# Patient Record
Sex: Male | Born: 1980 | Race: White | Hispanic: No | Marital: Single | Smoking: Current every day smoker
Health system: Southern US, Community
[De-identification: ages and names within clinical notes are randomized; demographics above are authoritative.]

---

## 2005-03-03 ENCOUNTER — Emergency Department: Payer: Self-pay | Admitting: General Practice

## 2005-03-03 ENCOUNTER — Emergency Department: Payer: Self-pay | Admitting: Unknown Physician Specialty

## 2020-02-25 ENCOUNTER — Encounter: Payer: Self-pay | Admitting: Intensive Care

## 2020-02-25 ENCOUNTER — Other Ambulatory Visit: Payer: Self-pay

## 2020-02-25 ENCOUNTER — Emergency Department
Admission: EM | Admit: 2020-02-25 | Discharge: 2020-02-25 | Disposition: A | Discharge status: Home / Self Care | Payer: Self-pay | Attending: Emergency Medicine | Admitting: Emergency Medicine

## 2020-02-25 ENCOUNTER — Emergency Department: Payer: Self-pay

## 2020-02-25 DIAGNOSIS — Z0289 Encounter for other administrative examinations: Secondary | ICD-10-CM | POA: Insufficient documentation

## 2020-02-25 DIAGNOSIS — Y92410 Unspecified street and highway as the place of occurrence of the external cause: Secondary | ICD-10-CM | POA: Insufficient documentation

## 2020-02-25 DIAGNOSIS — F17210 Nicotine dependence, cigarettes, uncomplicated: Secondary | ICD-10-CM | POA: Insufficient documentation

## 2020-02-25 DIAGNOSIS — S2231XA Fracture of one rib, right side, initial encounter for closed fracture: Secondary | ICD-10-CM | POA: Insufficient documentation

## 2020-02-25 DIAGNOSIS — Y9389 Activity, other specified: Secondary | ICD-10-CM | POA: Insufficient documentation

## 2020-02-25 DIAGNOSIS — Y999 Unspecified external cause status: Secondary | ICD-10-CM | POA: Insufficient documentation

## 2020-02-25 MED ORDER — METHOCARBAMOL 500 MG PO TABS: 500.0000 mg | ORAL_TABLET | Freq: Once | ORAL | Status: DC

## 2020-02-25 MED ORDER — NAPROXEN 500 MG PO TABS
500.0000 mg | ORAL_TABLET | Freq: Once | ORAL | Status: AC
  Administered 2020-02-25: 500 mg via ORAL
  Filled 2020-02-25: qty 1

## 2020-02-25 NOTE — ED Notes (Signed)
See triage note  Presents with police  States he is having rib pain  Needs clearance

## 2020-02-25 NOTE — ED Triage Notes (Signed)
Patient arrived in handcuffs by [employer]. Patient needs medical clearance to go to jail. Patient c/o right sided rib pain. Ambulatory into triage

## 2020-02-25 NOTE — ED Notes (Signed)
No peripheral IV placed this visit.   Discharge instructions reviewed with patient. Questions fielded by this RN. Patient verbalizes understanding of instructions. Patient discharged home in stable condition per provider . No acute distress noted at time of discharge.   Pt leaving with [employer] deputies in wheelchair att
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
## 2021-01-20 ENCOUNTER — Emergency Department
Admission: EM | Admit: 2021-01-20 | Discharge: 2021-01-22 | Disposition: A | Discharge status: Home / Self Care | Payer: Self-pay | Attending: Emergency Medicine | Admitting: Emergency Medicine

## 2021-01-20 ENCOUNTER — Other Ambulatory Visit: Payer: Self-pay

## 2021-01-20 ENCOUNTER — Emergency Department: Payer: Self-pay

## 2021-01-20 DIAGNOSIS — R4020 Unspecified coma: Secondary | ICD-10-CM | POA: Insufficient documentation

## 2021-01-20 DIAGNOSIS — Z20822 Contact with and (suspected) exposure to covid-19: Secondary | ICD-10-CM | POA: Insufficient documentation

## 2021-01-20 DIAGNOSIS — F17210 Nicotine dependence, cigarettes, uncomplicated: Secondary | ICD-10-CM | POA: Insufficient documentation

## 2021-01-20 DIAGNOSIS — F1910 Other psychoactive substance abuse, uncomplicated: Secondary | ICD-10-CM | POA: Insufficient documentation

## 2021-01-20 DIAGNOSIS — F32A Depression, unspecified: Secondary | ICD-10-CM | POA: Insufficient documentation

## 2021-01-20 DIAGNOSIS — R4589 Other symptoms and signs involving emotional state: Secondary | ICD-10-CM

## 2021-01-20 DIAGNOSIS — T402X1A Poisoning by other opioids, accidental (unintentional), initial encounter: Secondary | ICD-10-CM | POA: Insufficient documentation

## 2021-01-20 DIAGNOSIS — R4182 Altered mental status, unspecified: Secondary | ICD-10-CM | POA: Insufficient documentation

## 2021-01-20 DIAGNOSIS — F1924 Other psychoactive substance dependence with psychoactive substance-induced mood disorder: Secondary | ICD-10-CM | POA: Insufficient documentation

## 2021-01-20 DIAGNOSIS — T50904A Poisoning by unspecified drugs, medicaments and biological substances, undetermined, initial encounter: Secondary | ICD-10-CM

## 2021-01-20 LAB — URINE DRUG SCREEN, QUALITATIVE (ARMC ONLY)
Amphetamines, Ur Screen: POSITIVE — AB
Barbiturates, Ur Screen: NOT DETECTED
Benzodiazepine, Ur Scrn: NOT DETECTED
Cannabinoid 50 Ng, Ur ~~LOC~~: POSITIVE — AB
Cocaine Metabolite,Ur ~~LOC~~: POSITIVE — AB
MDMA (Ecstasy)Ur Screen: NOT DETECTED
Methadone Scn, Ur: NOT DETECTED
Opiate, Ur Screen: NOT DETECTED
Phencyclidine (PCP) Ur S: NOT DETECTED
Tricyclic, Ur Screen: NOT DETECTED

## 2021-01-20 LAB — CBC
HCT: 39.5 % (ref 39.0–52.0)
Hemoglobin: 12.9 g/dL — ABNORMAL LOW (ref 13.0–17.0)
MCH: 28.9 pg (ref 26.0–34.0)
MCHC: 32.7 g/dL (ref 30.0–36.0)
MCV: 88.6 fL (ref 80.0–100.0)
Platelets: 242 10*3/uL (ref 150–400)
RBC: 4.46 MIL/uL (ref 4.22–5.81)
RDW: 12.5 % (ref 11.5–15.5)
WBC: 9.6 10*3/uL (ref 4.0–10.5)
nRBC: 0 % (ref 0.0–0.2)

## 2021-01-20 LAB — COMPREHENSIVE METABOLIC PANEL
ALT: 20 U/L (ref 0–44)
AST: 20 U/L (ref 15–41)
Albumin: 3.7 g/dL (ref 3.5–5.0)
Alkaline Phosphatase: 54 U/L (ref 38–126)
Anion gap: 6 (ref 5–15)
BUN: 13 mg/dL (ref 6–20)
CO2: 28 mmol/L (ref 22–32)
Calcium: 9 mg/dL (ref 8.9–10.3)
Chloride: 104 mmol/L (ref 98–111)
Creatinine, Ser: 0.74 mg/dL (ref 0.61–1.24)
GFR, Estimated: >60 mL/min (ref >60)
Glucose, Bld: 83 mg/dL (ref 70–99)
Potassium: 3.2 mmol/L — ABNORMAL LOW (ref 3.5–5.1)
Sodium: 138 mmol/L (ref 135–145)
Total Bilirubin: 0.8 mg/dL (ref 0.3–1.2)
Total Protein: 6.4 g/dL — ABNORMAL LOW (ref 6.5–8.1)

## 2021-01-20 LAB — RESP PANEL BY RT-PCR (FLU A&B, COVID) ARPGX2
Influenza A by PCR: NEGATIVE
Influenza B by PCR: NEGATIVE
SARS Coronavirus 2 by RT PCR: NEGATIVE

## 2021-01-20 LAB — ACETAMINOPHEN LEVEL: Acetaminophen (Tylenol), Serum: <10 ug/mL — ABNORMAL LOW (ref 10–30)

## 2021-01-20 LAB — SALICYLATE LEVEL: Salicylate Lvl: <7 mg/dL — ABNORMAL LOW (ref 7.0–30.0)

## 2021-01-20 LAB — ETHANOL: Alcohol, Ethyl (B): <10 mg/dL (ref <10)

## 2021-01-20 MED ORDER — ONDANSETRON HCL 4 MG PO TABS: 4.0000 mg | ORAL_TABLET | Freq: Three times a day (TID) | ORAL | Status: DC | PRN

## 2021-01-20 MED ORDER — NICOTINE 21 MG/24HR TD PT24: 21.0000 mg | MEDICATED_PATCH | Freq: Every day | TRANSDERMAL | Status: DC

## 2021-01-20 NOTE — ED Notes (Signed)
IVC 

## 2021-01-20 NOTE — ED Notes (Signed)
Patient is now A&Ox3, talking with staff, ambulatory, and in no acute distress. MD made aware. Will continue to monitor and assess.

## 2021-01-20 NOTE — ED Notes (Signed)
Patient's still sleeping at this time, but arousable to voice. Family has been updated per patient's request. Will continue to monitor and assess.

## 2021-01-20 NOTE — ED Triage Notes (Signed)
Arrived by EMS. Patient snorted a pill and was found by brother. EMS reports seeing crushed up pills at scene. Brother gave 1mg  narcan IM. EMS blood pressure 148/98, 98%RA

## 2021-01-20 NOTE — ED Notes (Signed)
Patient is unable to complete TTS assessment at this time.

## 2021-01-20 NOTE — ED Triage Notes (Signed)
Pt arrives via EMS from after brother found him- pt had snorted some crushed up pills- brother gave 1 mg narcan IM- pt doe snot know what he snorted per EMS- pt lethargic but arousable to voice

## 2021-01-20 NOTE — ED Notes (Signed)
Pt moved to BHU 3. Report received from [facility]. Pt oriented to unit and room. Given pillows, blankets, and tv remote. Pt requesting sandwich tray and drink. Pt given food and drink. Denies any requests further at this time. Informed to let RN know if any needs arise.
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
## 2021-01-20 NOTE — ED Notes (Signed)
First encounter with patient. Assigned as Primary RN at this time. Patient is lethargic and arousable to voice. Will continue to monitor and assess.

## 2021-01-20 NOTE — ED Provider Notes (Signed)
[facility] Emergency Department Provider Note  ____________________________________________  Time seen: Approximately 2:36 PM  I have reviewed the triage vital signs and the nursing notes.   HISTORY  Chief Complaint Drug Overdose    Level 5 Caveat: Portions of the History and Physical including HPI and review of systems are unable to be completely obtained due to patient altered mental status from intoxication  HPI Brett Riddle is a 40 y.o. male with no significant past medical history was brought to the ED by EMS under IVC due to apparent overdose.  Patient was found unconscious with white powdery substance around him and on his face.  Was given a dose of Narcan on scene with some improvement in mental status.  Patient is somnolent but arousable, reports that he is not sure what he took.  He states that he wanted to feel better because he has been feeling depressed.  Denies feeling suicidal or wanting to kill himself.  No hallucinations.  Denies any acute pain or shortness of breath.      History reviewed. No pertinent past medical history.   There are no problems to display for this patient.    History reviewed. No pertinent surgical history.   Prior to Admission medications   Not on File     Allergies Vicodin [hydrocodone-acetaminophen]   No family history on file.  Social History Social History   Tobacco Use  . Smoking status: Current Every Day Smoker    Types: Cigarettes  . Smokeless tobacco: Never Used  Substance Use Topics  . Alcohol use: Not Currently  . Drug use: Yes    Types: Marijuana    Comment: meth    Review of Systems Level 5 Caveat: Portions of the History and Physical including HPI and review of systems are unable to be completely obtained due to patient being a poor historian   Constitutional:   No known fever.  ENT:   No rhinorrhea. Cardiovascular:   No chest pain or syncope. Respiratory:   No dyspnea  or cough. Gastrointestinal:   Negative for abdominal pain, vomiting and diarrhea.  Musculoskeletal:   Negative for focal pain or swelling ____________________________________________   PHYSICAL EXAM:  VITAL SIGNS: ED Triage Vitals  Enc Vitals Group     BP 01/20/21 1403 130/75     Pulse Rate 01/20/21 1403 (!) 54     Resp 01/20/21 1403 14     Temp 01/20/21 1403 97.6 F (36.4 C)     Temp Source 01/20/21 1403 Oral     SpO2 01/20/21 1403 99 %     Weight 01/20/21 1404 185 lb (83.9 kg)     Height 01/20/21 1404 5\' 7"  (1.702 m)     Head Circumference --      Peak Flow --      Pain Score 01/20/21 1404 0     Pain Loc --      Pain Edu? --      Excl. in GC? --     Vital signs reviewed, nursing assessments reviewed.   Constitutional:   Somnolent but arousable. Non-toxic appearance. Eyes:   Conjunctivae are normal. EOMI. PERRL. ENT      Head:   Normocephalic and atraumatic.      Nose:   No congestion/rhinnorhea.       Mouth/Throat:   MMM, no pharyngeal erythema. No peritonsillar mass.       Neck:   No meningismus. Full ROM. Hematological/Lymphatic/Immunilogical:   No cervical lymphadenopathy. Cardiovascular:  RRR. Symmetric bilateral radial and DP pulses.  No murmurs. Cap refill less than 2 seconds. Respiratory:   Normal respiratory effort without tachypnea/retractions. Breath sounds are clear and equal bilaterally. No wheezes/rales/rhonchi. Gastrointestinal:   Soft and nontender. Non distended. There is no CVA tenderness.  No rebound, rigidity, or guarding. Genitourinary:   deferred Musculoskeletal:   Normal range of motion in all extremities. No joint effusions.  No lower extremity tenderness.  No edema. Neurologic:   GCS 13 Slurred speech, restricted language expression Skin:    Skin is warm, dry and intact. No rash noted.  No petechiae, purpura, or bullae.  ____________________________________________    LABS (pertinent positives/negatives) (all labs ordered are listed, but  only abnormal results are displayed) Labs Reviewed  CBC - Abnormal; Notable for the following components:      Result Value   Hemoglobin 12.9 (*)    All other components within normal limits  COMPREHENSIVE METABOLIC PANEL  ETHANOL  SALICYLATE LEVEL  ACETAMINOPHEN LEVEL  URINE DRUG SCREEN, QUALITATIVE ([facility] ONLY)  CBG MONITORING, ED   ____________________________________________   EKG  Interpreted by me  Date: 01/20/2021  Rate: 53  Rhythm: normal sinus rhythm  QRS Axis: normal  Intervals: normal  ST/T Wave abnormalities: normal  Conduction Disutrbances: none  Narrative Interpretation: unremarkable      ____________________________________________    RADIOLOGY  No results found.  ____________________________________________   PROCEDURES Procedures  ____________________________________________    CLINICAL IMPRESSION / ASSESSMENT AND PLAN / ED COURSE  Medications ordered in the ED: Medications - No data to display  Pertinent labs & imaging results that were available during my care of the patient were reviewed by me and considered in my medical decision making (see chart for details).   Brett Riddle was evaluated in Emergency Department on 01/20/2021 for the symptoms described in the history of present illness. He was evaluated in the context of the global COVID-19 pandemic, which necessitated consideration that the patient might be at risk for infection with the SARS-CoV-2 virus that causes COVID-19. Institutional protocols and algorithms that pertain to the evaluation of patients at risk for COVID-19 are in a state of rapid change based on information released by regulatory bodies including the CDC and federal and state organizations. These policies and algorithms were followed during the patient's care in the ED.   Patient presents after apparent overdose, likely opioid.  Patient is currently somnolent but breathing adequately, maintaining 100% oxygen,  protecting his airway.  Does not require intubation or additional Narcan at this time.  He is under IVC, will observe until sober to proceed with psychiatric evaluation.  The patient has been placed in psychiatric observation due to the need to provide a safe environment for the patient while obtaining psychiatric consultation and evaluation, as well as ongoing medical and medication management to treat the patient's condition.  The patient has been placed under full IVC at this time.       ____________________________________________   FINAL CLINICAL IMPRESSION(S) / ED DIAGNOSES    Final diagnoses:  Drug overdose, undetermined intent, initial encounter  Depressed mood     ED Discharge Orders    None      Portions of this note were generated with dragon dictation software. Dictation errors may occur despite best attempts at proofreading.   Sharman Cheek, MD 01/20/21 [phone]

## 2021-01-20 NOTE — ED Notes (Signed)
Per officer with pt, pt is now under IVC- charge made aware of situation

## 2021-01-21 NOTE — ED Notes (Signed)
Pt provided sprite as requested

## 2021-01-21 NOTE — ED Notes (Signed)
This nurse contacts pharmacy who states that med rec completed. Dr. Erma Heritage states no meds that require ordering now.

## 2021-01-21 NOTE — ED Notes (Signed)
Pt mother Demetria Lightsey called stating she is "looking" for her son. Pt asleep at this time, has not given permission to share information. No information given to mother d/t pt age.  Will ask pt if can share information once awake  Juliette Mangle [phone]

## 2021-01-21 NOTE — ED Notes (Signed)
IVC/pending SOC consult. 

## 2021-01-21 NOTE — ED Notes (Signed)
Hourly rounding performed, patient currently awake in room. Patient has no complaints at this time. Q15 minute rounds and monitoring via Security Cameras to continue. 

## 2021-01-21 NOTE — ED Notes (Signed)
Report received from Brianna, RN including SBAR. Patient alert and oriented, warm and dry, in no acute distress. Patient denies SI, HI, AVH and pain. Patient made aware of Q15 minute rounds and security cameras for their safety. Patient instructed to come to this nurse with needs or concerns. 

## 2021-01-21 NOTE — ED Provider Notes (Signed)
Emergency Medicine Observation Re-evaluation Note  Brett Riddle is a 40 y.o. male, seen on rounds today.  Pt initially presented to the ED for complaints of Drug Overdose Currently, the patient is resting comfortably.  Physical Exam  BP 135/73   Pulse (!) 52   Temp 97.6 F (36.4 C) (Oral)   Resp 11   Ht 5\' 7"  (1.702 m)   Wt 83.9 kg   SpO2 100%   BMI 28.98 kg/m  Physical Exam Constitutional:      Appearance: He is not ill-appearing or toxic-appearing.  HENT:     Head: Atraumatic.  Cardiovascular:     Comments: Well perfused Pulmonary:     Effort: Pulmonary effort is normal.  Abdominal:     General: There is no distension.  Musculoskeletal:        General: No deformity.  Skin:    Findings: No rash.  Neurological:     General: No focal deficit present.     Cranial Nerves: No cranial nerve deficit.      ED Course / MDM  EKG:   I have reviewed the labs performed to date as well as medications administered while in observation.  Recent changes in the last 24 hours include awaiting telepsychiatry evaluation.  Plan  Current plan is for Psychiatric evaluation. Patient is under full IVC at this time.   , MD 01/21/21 [phone]

## 2021-01-21 NOTE — ED Notes (Signed)
Pt provided breakfast tray.

## 2021-01-21 NOTE — ED Notes (Signed)
Pt mother updated to the best of this RN ability with pt permission

## 2021-01-21 NOTE — ED Notes (Signed)
Hourly rounding performed, patient currently asleep in room. Patient has no complaints at this time. Q15 minute rounds and monitoring via Security Cameras to continue. 

## 2021-01-21 NOTE — ED Notes (Signed)
Pt provided dinner tray.

## 2021-01-21 NOTE — ED Notes (Signed)
Pt reports that he did not snort pills as said before, states he was afraid to tell the truth. States he shot up what he believed to be heroin and this resulted in an accidental OD. Pt states that after what prior nurses informed him of his UDS he feels that he was given wrong drug.

## 2021-01-21 NOTE — ED Notes (Signed)
SOC complete/IVC rescinded.

## 2021-01-21 NOTE — ED Notes (Signed)
Pt given sandwich tray and drink at this time.

## 2021-01-21 NOTE — ED Provider Notes (Signed)
The patient has been evaluated at bedside by [facility] psychiatry.  Patient is clinically stable.  Not felt to be a danger to self or others.  No SI or Hi.  No indication for inpatient psychiatric admission at this time.  Appropriate for continued outpatient therapy.    Willy Eddy, MD 01/21/21 [phone]

## 2021-01-21 NOTE — ED Notes (Signed)
Pt asked for additional food. States he is so hungry. Tech gave him an extra breakfast tray. Pt was very satisfied with this and appreciative.  Pt calm and resting at this time.

## 2021-01-21 NOTE — ED Notes (Signed)
Called Soc spoke with Dareen Piano, pt still in cue for consult

## 2021-01-21 NOTE — ED Notes (Signed)
Pt provided phone to call mother, number dialed by this RN

## 2021-01-21 NOTE — ED Notes (Signed)
SOC on consult call with pt at this time

## 2021-01-22 NOTE — ED Notes (Signed)
Hourly rounding performed, patient currently asleep in room. Patient has no complaints at this time. Q15 minute rounds and monitoring via Security Cameras to continue. 

## 2021-01-22 NOTE — ED Notes (Signed)
Pt provided with resources for substance use and other resources with DC, has no questions. Bag 1 of 1 returned to pt and verified that all belongings are present.

## 2021-01-22 NOTE — ED Notes (Signed)
Pt mother contacted at this time, states she will come and pick pt up. Pt will be DC'd when mother arrives as she lives around 45 minutes away
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
## 2022-05-11 IMAGING — DX DG CHEST 1V PORT
1 series · 1 of 1 positions shown · non-contrast
Comparison: 02/25/2020.

CLINICAL DATA: Opioid overdose edge.  Smoker.

EXAM:
PORTABLE CHEST 1 VIEW

[chest ap]
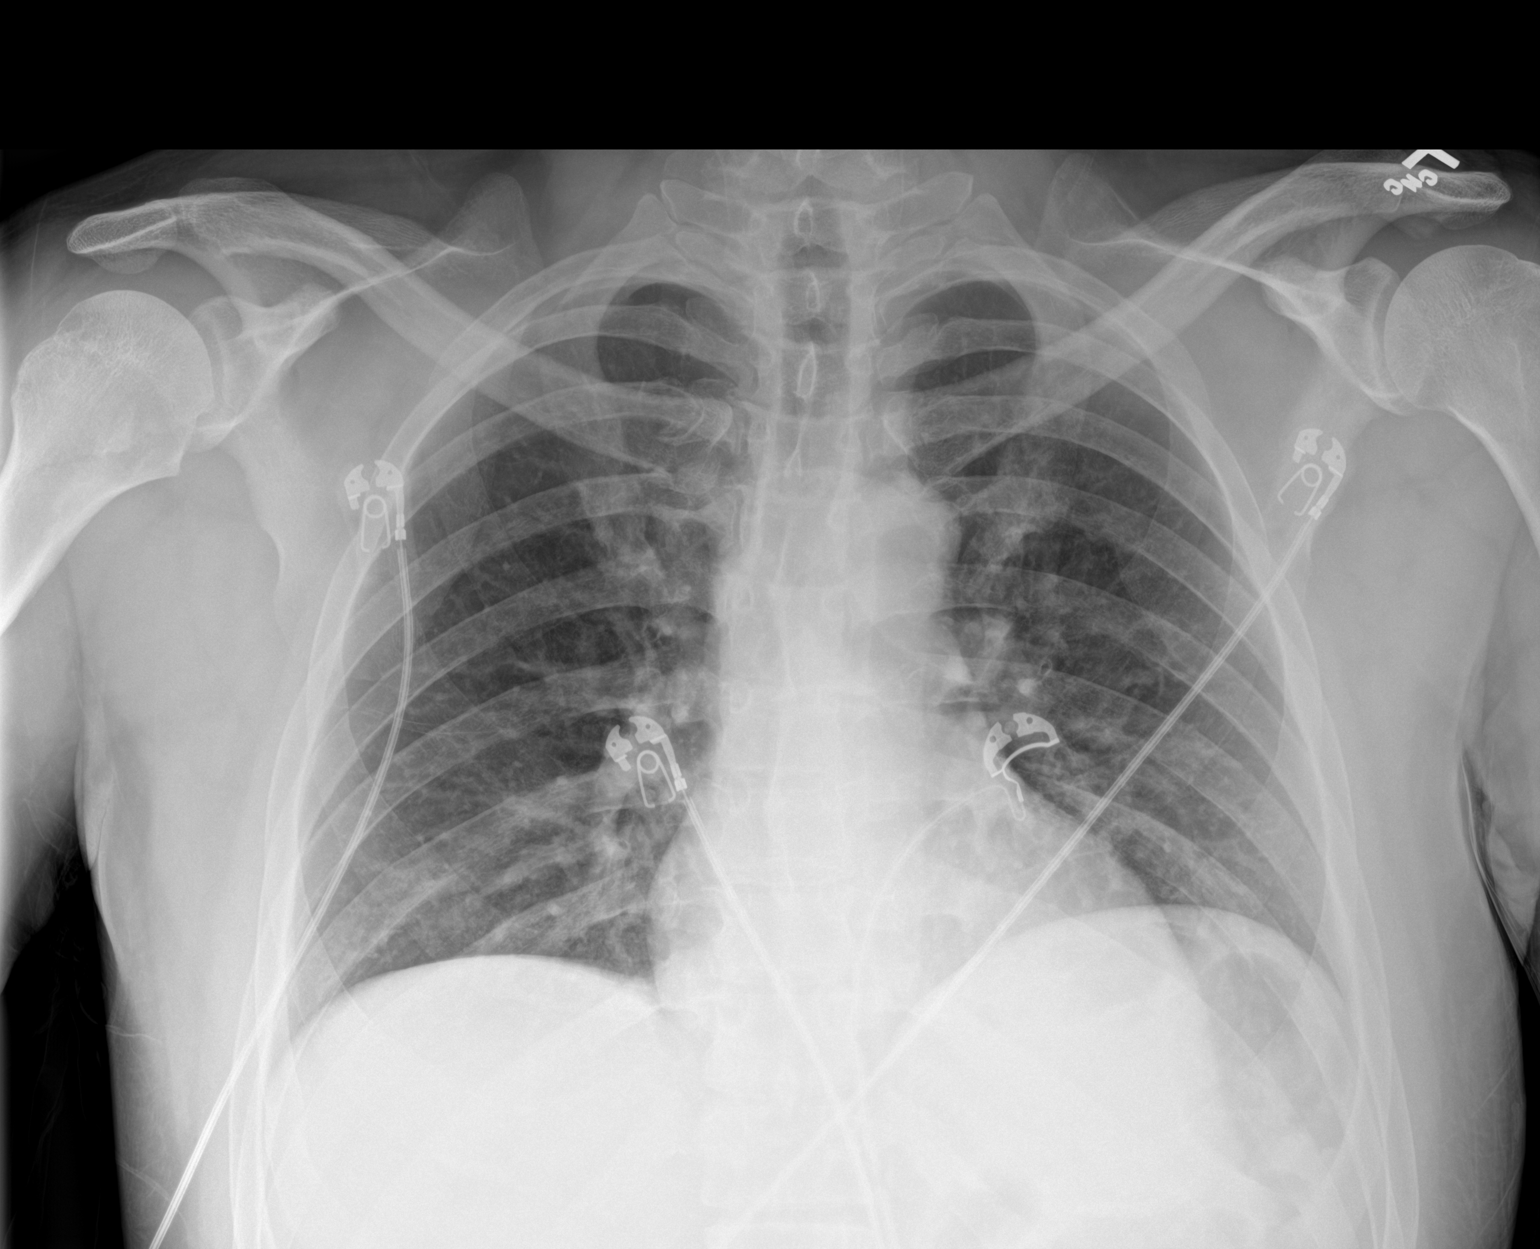

[1 of 1 positions shown; findings below may reference images not displayed]

FINDINGS: Cardiac silhouette is normal in size. Normal mediastinal and hilar
contours.

Clear lungs.  No pleural effusion or pneumothorax.

Skeletal structures are grossly intact.
IMPRESSION: No active disease.
